# Patient Record
Sex: Male | Born: 1985 | Race: Black or African American | Hispanic: No | Marital: Single | Smoking: Never smoker
Health system: Southern US, Community
[De-identification: ages and names within clinical notes are randomized; demographics above are authoritative.]

## PROBLEM LIST (undated history)

## (undated) DIAGNOSIS — C9590 Leukemia, unspecified not having achieved remission: Secondary | ICD-10-CM

## (undated) HISTORY — PX: PORT-A-CATH REMOVAL: SHX5289

---
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
## 2010-01-12 ENCOUNTER — Inpatient Hospital Stay: Payer: Self-pay | Admitting: Internal Medicine

## 2010-03-08 ENCOUNTER — Emergency Department: Payer: Self-pay | Admitting: Emergency Medicine

## 2010-12-13 ENCOUNTER — Emergency Department: Payer: Self-pay | Admitting: Emergency Medicine

## 2011-02-13 ENCOUNTER — Ambulatory Visit: Payer: Self-pay | Admitting: Oncology

## 2011-02-22 ENCOUNTER — Ambulatory Visit: Payer: Self-pay | Admitting: Oncology

## 2012-10-07 ENCOUNTER — Emergency Department: Payer: Self-pay | Admitting: Emergency Medicine

## 2013-05-25 ENCOUNTER — Emergency Department: Payer: Self-pay | Admitting: Emergency Medicine

## 2013-05-26 ENCOUNTER — Ambulatory Visit: Payer: Self-pay | Admitting: Oncology

## 2013-05-26 LAB — CBC WITH DIFFERENTIAL/PLATELET
BASOS ABS: 0 10*3/uL (ref 0.0–0.1)
BASOS PCT: 0.9 %
Eosinophil #: 0.2 10*3/uL (ref 0.0–0.7)
Eosinophil %: 3.1 %
HCT: 46 % (ref 40.0–52.0)
HGB: 15.7 g/dL (ref 13.0–18.0)
LYMPHS PCT: 49.1 %
Lymphocyte #: 2.6 10*3/uL (ref 1.0–3.6)
MCH: 29.9 pg (ref 26.0–34.0)
MCHC: 34.1 g/dL (ref 32.0–36.0)
MCV: 88 fL (ref 80–100)
MONOS PCT: 10.7 %
Monocyte #: 0.6 x10 3/mm (ref 0.2–1.0)
NEUTROS ABS: 1.9 10*3/uL (ref 1.4–6.5)
Neutrophil %: 36.2 %
Platelet: 194 10*3/uL (ref 150–440)
RBC: 5.25 10*6/uL (ref 4.40–5.90)
RDW: 13.7 % (ref 11.5–14.5)
WBC: 5.2 10*3/uL (ref 3.8–10.6)

## 2013-05-26 LAB — BASIC METABOLIC PANEL
ANION GAP: 6 — AB (ref 7–16)
BUN: 18 mg/dL (ref 7–18)
CALCIUM: 9 mg/dL (ref 8.5–10.1)
CREATININE: 1.44 mg/dL — AB (ref 0.60–1.30)
Chloride: 104 mmol/L (ref 98–107)
Co2: 29 mmol/L (ref 21–32)
EGFR (African American): >60
EGFR (Non-African Amer.): >60
GLUCOSE: 96 mg/dL (ref 65–99)
OSMOLALITY: 279 (ref 275–301)
Potassium: 3.5 mmol/L (ref 3.5–5.1)
Sodium: 139 mmol/L (ref 136–145)

## 2014-04-28 ENCOUNTER — Ambulatory Visit: Payer: Self-pay | Admitting: General Practice

## 2014-06-23 ENCOUNTER — Ambulatory Visit: Payer: Self-pay

## 2015-05-21 ENCOUNTER — Emergency Department
Admission: EM | Admit: 2015-05-21 | Discharge: 2015-05-21 | Disposition: A | Discharge status: Home / Self Care | Payer: Self-pay | Attending: Emergency Medicine | Admitting: Emergency Medicine

## 2015-05-21 ENCOUNTER — Encounter: Payer: Self-pay | Admitting: Emergency Medicine

## 2015-05-21 DIAGNOSIS — M791 Myalgia, unspecified site: Secondary | ICD-10-CM

## 2015-05-21 HISTORY — DX: Leukemia, unspecified not having achieved remission: C95.90

## 2015-05-21 LAB — BASIC METABOLIC PANEL
Anion gap: 6 (ref 5–15)
BUN: 19 mg/dL (ref 6–20)
CO2: 30 mmol/L (ref 22–32)
Calcium: 9.6 mg/dL (ref 8.9–10.3)
Chloride: 105 mmol/L (ref 101–111)
Creatinine, Ser: 1.28 mg/dL — ABNORMAL HIGH (ref 0.61–1.24)
Glucose, Bld: 99 mg/dL (ref 65–99)
POTASSIUM: 4.7 mmol/L (ref 3.5–5.1)
SODIUM: 141 mmol/L (ref 135–145)

## 2015-05-21 LAB — CBC WITH DIFFERENTIAL/PLATELET
BASOS ABS: 0 10*3/uL (ref 0–0.1)
BASOS PCT: 1 %
Eosinophils Absolute: 0.1 10*3/uL (ref 0–0.7)
Eosinophils Relative: 3 %
HCT: 45.1 % (ref 40.0–52.0)
Hemoglobin: 15.3 g/dL (ref 13.0–18.0)
LYMPHS PCT: 54 %
Lymphs Abs: 1.6 10*3/uL (ref 1.0–3.6)
MCH: 29 pg (ref 26.0–34.0)
MCHC: 34 g/dL (ref 32.0–36.0)
MCV: 85.3 fL (ref 80.0–100.0)
Monocytes Absolute: 0.3 10*3/uL (ref 0.2–1.0)
Monocytes Relative: 11 %
NEUTROS ABS: 0.9 10*3/uL — AB (ref 1.4–6.5)
NEUTROS PCT: 31 %
Platelets: 216 10*3/uL (ref 150–440)
RBC: 5.28 MIL/uL (ref 4.40–5.90)
RDW: 14.1 % (ref 11.5–14.5)
WBC: 3 10*3/uL — AB (ref 3.8–10.6)

## 2015-05-21 MED ORDER — DIAZEPAM 5 MG/ML IJ SOLN
5.0000 mg | Freq: Once | INTRAMUSCULAR | Status: AC
  Administered 2015-05-21: 5 mg via INTRAMUSCULAR
  Filled 2015-05-21: qty 2

## 2015-05-21 MED ORDER — IBUPROFEN 800 MG PO TABS: 800.0000 mg | ORAL_TABLET | Freq: Three times a day (TID) | ORAL | Status: DC | PRN

## 2015-05-21 MED ORDER — KETOROLAC TROMETHAMINE 60 MG/2ML IM SOLN
60.0000 mg | Freq: Once | INTRAMUSCULAR | Status: AC
  Administered 2015-05-21: 60 mg via INTRAMUSCULAR
  Filled 2015-05-21: qty 2

## 2015-05-21 MED ORDER — CYCLOBENZAPRINE HCL 10 MG PO TABS: 10.0000 mg | ORAL_TABLET | Freq: Three times a day (TID) | ORAL | Status: DC | PRN

## 2015-05-21 NOTE — ED Notes (Signed)
States awoke this am with swelling R hand and bump on forehead. Now hand noted slightly swollen, bump on forehead resolved. States has had leg cramps at night that keep him from sleeping. Denies any injuries.

## 2015-05-21 NOTE — Discharge Instructions (Signed)

## 2015-05-21 NOTE — ED Provider Notes (Signed)
[facility] Emergency Department Provider Note  ____________________________________________  Time seen: Approximately 11:03 AM  I have reviewed the triage vital signs and the nursing notes.   HISTORY  Chief Complaint Hand Problem    HPI Anthony Parks is a 30 y.o. male who presents with complaints of right arm swelling right hand swelling. Complains of basically all his legs and muscles are cramping. States this has been keeping him awake at nighttime. Past medical history is significant for leukemia.   Past Medical History  Diagnosis Date  . Leukemia ([place])     states had leukemia as a child, now resolved, does not know type    There are no active problems to display for this patient.   History reviewed. No pertinent past surgical history.  Current Outpatient Rx  Name  Route  Sig  Dispense  Refill  . cyclobenzaprine (FLEXERIL) 10 MG tablet   Oral   Take 1 tablet (10 mg total) by mouth every 8 (eight) hours as needed for muscle spasms.   30 tablet   1   . ibuprofen (ADVIL,MOTRIN) 800 MG tablet   Oral   Take 1 tablet (800 mg total) by mouth every 8 (eight) hours as needed.   30 tablet   0     Allergies Review of patient's allergies indicates no known allergies.  No family history on file.  Social History Social History  Substance Use Topics  . Smoking status: Never Smoker   . Smokeless tobacco: None  . Alcohol Use: No    Review of Systems Constitutional: No fever/chills Eyes: No visual changes. ENT: No sore throat. Cardiovascular: Denies chest pain. Respiratory: Denies shortness of breath. Gastrointestinal: No abdominal pain.  No nausea, no vomiting.  No diarrhea.  No constipation. Genitourinary: Negative for dysuria. Musculoskeletal: Negative for back pain. Skin: Negative for rash. Neurological: Negative for headaches, focal weakness or numbness.  10-point ROS otherwise  negative.  ____________________________________________   PHYSICAL EXAM:  VITAL SIGNS: ED Triage Vitals  Enc Vitals Group     BP 05/21/15 1019 117/67 mmHg     Pulse Rate 05/21/15 1019 68     Resp 05/21/15 1019 20     Temp 05/21/15 1019 97.9 F (36.6 C)     Temp Source 05/21/15 1019 Oral     SpO2 05/21/15 1019 100 %     Weight 05/21/15 1019 204 lb (92.534 kg)     Height 05/21/15 1019 6' (1.829 m)     Head Cir --      Peak Flow --      Pain Score 05/21/15 1020 3     Pain Loc --      Pain Edu? --      Excl. in [place]? --     Constitutional: Alert and oriented. Well appearing and in no acute distress. Head: Atraumatic. Nose: No congestion/rhinnorhea. Mouth/Throat: Mucous membranes are moist.  Oropharynx non-erythematous. Neck: No stridor.   Cardiovascular: Normal rate, regular rhythm. Grossly normal heart sounds.  Good peripheral circulation. Respiratory: Normal respiratory effort.  No retractions. Lungs CTAB. Musculoskeletal: No lower extremity tenderness nor edema.  No joint effusions. Neurologic:  Normal speech and language. No gross focal neurologic deficits are appreciated. No gait instability. Skin:  Skin is warm, dry and intact. No rash noted. Psychiatric: Mood and affect are normal. Speech and behavior are normal.  ____________________________________________   LABS (all labs ordered are listed, but only abnormal results are displayed)  Labs Reviewed  BASIC METABOLIC PANEL -  Abnormal; Notable for the following:    Creatinine, Ser 1.28 (*)    All other components within normal limits  CBC WITH DIFFERENTIAL/PLATELET - Abnormal; Notable for the following:    WBC 3.0 (*)    Neutro Abs 0.9 (*)    All other components within normal limits     PROCEDURES  Procedure(s) performed: None  Critical Care performed: No  ____________________________________________   INITIAL IMPRESSION / ASSESSMENT AND PLAN / ED COURSE  Pertinent labs & imaging results that were  available during my care of the patient were reviewed by me and considered in my medical decision making (see chart for details).  Generalized myalgia. Rx given for Flexeril 10 mg 3 times a day, Motrin 800 mg 3 times a day. Patient was given an injection of Toradol and Valium while in the ED. Patient follow-up with PCP or return to ER with any worsening symptomology. Patient voices no other emergency medical complaints at this time. ____________________________________________   FINAL CLINICAL IMPRESSION(S) / ED DIAGNOSES  Final diagnoses:  Myalgia      Arlyss Repress, PA-C 05/21/15 1211  Harvest Dark, MD 05/21/15 1427

## 2015-08-01 ENCOUNTER — Emergency Department
Admission: EM | Admit: 2015-08-01 | Discharge: 2015-08-01 | Disposition: A | Discharge status: Home / Self Care | Payer: Self-pay | Attending: Emergency Medicine | Admitting: Emergency Medicine

## 2015-08-01 DIAGNOSIS — Z711 Person with feared health complaint in whom no diagnosis is made: Secondary | ICD-10-CM | POA: Insufficient documentation

## 2015-08-01 NOTE — Discharge Instructions (Signed)

## 2015-08-01 NOTE — ED Provider Notes (Signed)
[facility] Emergency Department Provider Note  ____________________________________________  Time seen: Approximately 12:17 PM  I have reviewed the triage vital signs and the nursing notes.   HISTORY  Chief Complaint Medical Clearance    HPI Anthony Parks is a 30 y.o. male , NAD, presents emergency department to gain a return to work note. States he felt some left-sided groin pain while at work Friday night. Was lifting boxes and machine parts from the floor when the pain began. States he left work early and stay out of work for 2 days to rest. States he has had no further pain and is ready to return to work full duty. Has not taken anything for pain. Denies any masses or swellings about the groin area. Denies urinary frequency, hesitancy, urgency nor any urethral discharge. Has had no testicular pain. Denies numbness, weakness, tingling. Has had no lower back pain.   Past Medical History  Diagnosis Date  . 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   Past Surgical History  Procedure Laterality Date  . Port-a-cath removal      Current Outpatient Rx  Name  Route  Sig  Dispense  Refill  . cyclobenzaprine (FLEXERIL) 10 MG tablet   Oral   Take 1 tablet (10 mg total) by mouth every 8 (eight) hours as needed for muscle spasms.   30 tablet   1   . ibuprofen (ADVIL,MOTRIN) 800 MG tablet   Oral   Take 1 tablet (800 mg total) by mouth every 8 (eight) hours as needed.   30 tablet   0     Allergies Review of patient's allergies indicates no known allergies.  No family history on file.  Social History Social History  Substance Use Topics  . Smoking status: Never Smoker   . Smokeless tobacco: None  . Alcohol Use: No     Review of Systems  Constitutional: No fever/chills, fatigue Cardiovascular: No chest pain. Respiratory: No cough. No  shortness of breath.   Gastrointestinal: No abdominal pain.  No nausea, vomiting.   Genitourinary: Positive left groin pain that has resolved. Negative for dysuria, urethral discharge, hematuria. No urinary hesitancy, urgency or increased frequency. Musculoskeletal: Negative for back pain.  Skin: Negative for rash, skin sores. Neurological: Negative for headaches, focal weakness or numbness. 10-point ROS otherwise negative.  ____________________________________________   PHYSICAL EXAM:  VITAL SIGNS: ED Triage Vitals  Enc Vitals Group     BP 08/01/15 1207 131/75 mmHg     Pulse Rate 08/01/15 1207 56     Resp 08/01/15 1207 16     Temp 08/01/15 1207 97.6 F (36.4 C)     Temp Source 08/01/15 1207 Oral     SpO2 08/01/15 1207 99 %     Weight 08/01/15 1207 205 lb (92.987 kg)     Height 08/01/15 1207 6' (1.829 m)     Head Cir --      Peak Flow --      Pain Score --      Pain Loc --      Pain Edu? --      Excl. in [place]? --     Physical exam completed in the presence of Halford Decamp, RN  Constitutional: Alert and oriented. Well appearing and in no acute distress. Eyes: Conjunctivae are normal.  Head: Atraumatic. Hematological/Lymphatic/Immunilogical: No inguinal lymphadenopathy.  Cardiovascular: Good peripheral circulation. Respiratory: Normal respiratory effort without tachypnea or retractions.  Gastrointestinal: Soft and nontender. No distention or guarding.  Genitourinary: Tanner Stage 5. No inguinal nor scrotal hernias. External genitalia without abnormality.  Musculoskeletal: Full range of motion of lumbar spine without stiffness or pain. No tenderness to palpation about the lumbar or sacral spine. No lower extremity tenderness nor edema.  No joint effusions. Neurologic:  Normal speech and language. No gross focal neurologic deficits are appreciated.  Skin:  Skin is warm, dry and intact. No rash, swelling, bruising noted. Psychiatric: Mood and affect are normal. Speech and  behavior are normal. Patient exhibits appropriate insight and judgement.   ____________________________________________   LABS  None  ____________________________________________  EKG  None ____________________________________________  RADIOLOGY  None ____________________________________________    PROCEDURES  Procedure(s) performed: None      Medications - No data to display   ____________________________________________   INITIAL IMPRESSION / ASSESSMENT AND PLAN / ED COURSE  Reassured that he had no evidence of any hernias at this time. He was given a return to work note stating he could return to work full duty as of today. Patient will be discharged home with instructions for back exercises to strengthen his lower back and core since he will be doing more lifting with his current employment.  Patient is given ED precautions to return to the ED for any worsening or new symptoms.      ____________________________________________  FINAL CLINICAL IMPRESSION(S) / ED DIAGNOSES  Final diagnoses:  Feared complaint without diagnosis      NEW MEDICATIONS STARTED DURING THIS VISIT:  Discharge Medication List as of 08/01/2015 12:26 PM           Braxton Feathers, PA-C 08/01/15 1327  Lavonia Drafts, MD 08/01/15 1440

## 2015-08-01 NOTE — ED Notes (Signed)
Pt states he hurt his back at work and went back to work yesterday but they told him he had to be checked out before he could return to work

## 2015-08-01 NOTE — ED Notes (Signed)
States he felt some pain to groin area on Friday while at work.  States pain went into back and groin  Feels better today but work wants him checked out   Denies any urinary sx's or swelling to groin area

## 2015-11-27 ENCOUNTER — Emergency Department
Admission: EM | Admit: 2015-11-27 | Discharge: 2015-11-27 | Disposition: A | Discharge status: Home / Self Care | Payer: Self-pay | Attending: Emergency Medicine | Admitting: Emergency Medicine

## 2015-11-27 ENCOUNTER — Encounter: Payer: Self-pay | Admitting: Emergency Medicine

## 2015-11-27 ENCOUNTER — Emergency Department: Payer: Self-pay

## 2015-11-27 DIAGNOSIS — W500XXA Accidental hit or strike by another person, initial encounter: Secondary | ICD-10-CM | POA: Insufficient documentation

## 2015-11-27 DIAGNOSIS — Y9367 Activity, basketball: Secondary | ICD-10-CM | POA: Insufficient documentation

## 2015-11-27 DIAGNOSIS — G8911 Acute pain due to trauma: Secondary | ICD-10-CM

## 2015-11-27 DIAGNOSIS — S8992XA Unspecified injury of left lower leg, initial encounter: Secondary | ICD-10-CM | POA: Insufficient documentation

## 2015-11-27 DIAGNOSIS — Y929 Unspecified place or not applicable: Secondary | ICD-10-CM | POA: Insufficient documentation

## 2015-11-27 DIAGNOSIS — Y999 Unspecified external cause status: Secondary | ICD-10-CM | POA: Insufficient documentation

## 2015-11-27 DIAGNOSIS — M79605 Pain in left leg: Secondary | ICD-10-CM

## 2015-11-27 MED ORDER — NAPROXEN 500 MG PO TABS: 500.0000 mg | ORAL_TABLET | Freq: Two times a day (BID) | ORAL | 0 refills | Status: DC

## 2015-11-27 MED ORDER — HYDROCODONE-ACETAMINOPHEN 5-325 MG PO TABS
1.0000 | ORAL_TABLET | Freq: Once | ORAL | Status: AC
  Administered 2015-11-27: 1 via ORAL
  Filled 2015-11-27: qty 1

## 2015-11-27 MED ORDER — HYDROCODONE-ACETAMINOPHEN 5-325 MG PO TABS: 1.0000 | ORAL_TABLET | ORAL | 0 refills | Status: DC | PRN

## 2015-11-27 NOTE — ED Provider Notes (Signed)
[facility] Emergency Department Provider Note ____________________________________________  Time seen: Approximately 6:41 PM  I have reviewed the triage vital signs and the nursing notes.   HISTORY  Chief Complaint Leg Injury    HPI Anthony Parks is a 30 y.o. male who presents to the emergency department for evaluation of left leg pain. Prior to arrival, he was playing basketball and someone rammed into his body, and then he heard a pop in his left knee and leg. Pain is sharp and shooting from the anterior aspect of the knee radiating to the ankle.   Past Medical History:  Diagnosis Date  . 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   Past Surgical History:  Procedure Laterality Date  . PORT-A-CATH REMOVAL      Prior to Admission medications   Medication Sig Start Date End Date Taking? Authorizing Provider  cyclobenzaprine (FLEXERIL) 10 MG tablet Take 1 tablet (10 mg total) by mouth every 8 (eight) hours as needed for muscle spasms. 05/21/15   Arlyss Repress, PA-C  HYDROcodone-acetaminophen (NORCO/VICODIN) 5-325 MG tablet Take 1 tablet by mouth every 4 (four) hours as needed for moderate pain. 11/27/15 11/26/16  Victorino Dike, FNP  ibuprofen (ADVIL,MOTRIN) 800 MG tablet Take 1 tablet (800 mg total) by mouth every 8 (eight) hours as needed. 05/21/15   Pierce Crane Beers, PA-C  naproxen (NAPROSYN) 500 MG tablet Take 1 tablet (500 mg total) by mouth 2 (two) times daily with a meal. 11/27/15   Victorino Dike, FNP    Allergies Review of patient's allergies indicates no known allergies.  History reviewed. No pertinent family history.  Social History Social History  Substance Use Topics  . Smoking status: Never Smoker  . Smokeless tobacco: Never Used  . Alcohol use No    Review of Systems Constitutional: No recent illness. Cardiovascular: Denies chest  pain or palpitations. Respiratory: Denies shortness of breath. Musculoskeletal: Pain in Left lower extremity Skin: Negative for rash, wound, lesion. Neurological: Negative for focal weakness or numbness.  ____________________________________________   PHYSICAL EXAM:  VITAL SIGNS: ED Triage Vitals [11/27/15 1832]  Enc Vitals Group     BP (!) 137/97     Pulse Rate (!) 115     Resp 20     Temp 99 F (37.2 C)     Temp Source Oral     SpO2 97 %     Weight 205 lb (93 kg)     Height 6' (1.829 m)     Head Circumference      Peak Flow      Pain Score 10     Pain Loc      Pain Edu?      Excl. in [place]?     Constitutional: Alert and oriented. Well appearing and in no acute distress. Eyes: Conjunctivae are normal. EOMI. Head: Atraumatic. Neck: No stridor.  Respiratory: Normal respiratory effort.   Musculoskeletal: Diffuse tenderness of the left lower extremity from the anterior aspect of the knee to the ankle without obvious deformity. There is no focal bony tenderness elicited on exam. Neurologic:  Normal speech and language. No gross focal neurologic deficits are appreciated. Speech is normal. No gait instability. Skin:  Skin is warm, dry and intact. Atraumatic. Psychiatric: Mood and affect are normal. Speech and behavior are normal.  ____________________________________________   LABS (all labs  ordered are listed, but only abnormal results are displayed)  Labs Reviewed - No data to display ____________________________________________  RADIOLOGY  Negative for acute bony abnormality. ____________________________________________   PROCEDURES  Procedure(s) performed: Knee immobilizer applied to the left lower extremity by ER tech. Patient was neurovascularly intact post-application.   ____________________________________________   INITIAL IMPRESSION / ASSESSMENT AND PLAN / ED COURSE  Pertinent labs & imaging results that were available during my care of the patient  were reviewed by me and considered in my medical decision making (see chart for details).  Patient was encouraged to follow up with the orthopedist for symptoms that are not improving over the week. He was encouraged to rest, ice, and elevate the lower extremity while still painful. He was given a prescription for Norco 5/325 (12 tablets) and Naprosyn. He was advised to return to the emergency department for symptoms that change or worsen if he is unable schedule an appointment with orthopedics or his primary care provider. ____________________________________________   FINAL CLINICAL IMPRESSION(S) / ED DIAGNOSES  Final diagnoses:  Acute pain due to injury  Lower extremity pain, diffuse, left       Victorino Dike, FNP 11/27/15 2011    Orbie Pyo, MD 11/27/15 2350

## 2015-11-27 NOTE — ED Triage Notes (Signed)
Pt playing basketball PTA and was hit by another player in left leg. States he heard a pop. C/o knee and leg pain that is sharp and shooting. Does not want to take shoe off at this time.

## 2016-02-01 ENCOUNTER — Emergency Department: Payer: Self-pay

## 2016-02-01 ENCOUNTER — Emergency Department
Admission: EM | Admit: 2016-02-01 | Discharge: 2016-02-01 | Disposition: A | Discharge status: Home / Self Care | Payer: Self-pay | Attending: Student in an Organized Health Care Education/Training Program | Admitting: Student in an Organized Health Care Education/Training Program

## 2016-02-01 DIAGNOSIS — R5383 Other fatigue: Secondary | ICD-10-CM | POA: Insufficient documentation

## 2016-02-01 DIAGNOSIS — R509 Fever, unspecified: Secondary | ICD-10-CM | POA: Insufficient documentation

## 2016-02-01 DIAGNOSIS — R519 Headache, unspecified: Secondary | ICD-10-CM

## 2016-02-01 DIAGNOSIS — Z79899 Other long term (current) drug therapy: Secondary | ICD-10-CM | POA: Insufficient documentation

## 2016-02-01 DIAGNOSIS — R51 Headache: Secondary | ICD-10-CM | POA: Insufficient documentation

## 2016-02-01 DIAGNOSIS — R6889 Other general symptoms and signs: Secondary | ICD-10-CM

## 2016-02-01 DIAGNOSIS — R531 Weakness: Secondary | ICD-10-CM | POA: Insufficient documentation

## 2016-02-01 DIAGNOSIS — R110 Nausea: Secondary | ICD-10-CM | POA: Insufficient documentation

## 2016-02-01 LAB — CSF CELL COUNT WITH DIFFERENTIAL
EOS CSF: 0 %
EOS CSF: 0 %
LYMPHS CSF: 86 %
Lymphs, CSF: 84 %
MONOCYTE-MACROPHAGE-SPINAL FLUID: 10 %
Monocyte-Macrophage-Spinal Fluid: 11 %
Other Cells, CSF: 0
Other Cells, CSF: 0
RBC COUNT CSF: 0 /mm3 (ref 0–3)
RBC Count, CSF: 4 /mm3 — ABNORMAL HIGH (ref 0–3)
SEGMENTED NEUTROPHILS-CSF: 3 %
SEGMENTED NEUTROPHILS-CSF: 6 %
TUBE #: 1
Tube #: 4
WBC, CSF: 11 /mm3 (ref 0–5)
WBC, CSF: 4 /mm3 (ref 0–5)

## 2016-02-01 LAB — COMPREHENSIVE METABOLIC PANEL
ALK PHOS: 67 U/L (ref 38–126)
ALT: 13 U/L — ABNORMAL LOW (ref 17–63)
AST: 18 U/L (ref 15–41)
Albumin: 4.1 g/dL (ref 3.5–5.0)
Anion gap: 8 (ref 5–15)
BILIRUBIN TOTAL: 0.4 mg/dL (ref 0.3–1.2)
BUN: 19 mg/dL (ref 6–20)
CALCIUM: 9.1 mg/dL (ref 8.9–10.3)
CO2: 26 mmol/L (ref 22–32)
CREATININE: 1.51 mg/dL — AB (ref 0.61–1.24)
Chloride: 103 mmol/L (ref 101–111)
GFR calc Af Amer: >60 mL/min (ref >60)
Glucose, Bld: 102 mg/dL — ABNORMAL HIGH (ref 65–99)
POTASSIUM: 3.9 mmol/L (ref 3.5–5.1)
Sodium: 137 mmol/L (ref 135–145)
TOTAL PROTEIN: 7.6 g/dL (ref 6.5–8.1)

## 2016-02-01 LAB — PROTEIN AND GLUCOSE, CSF
GLUCOSE CSF: 60 mg/dL (ref 40–70)
Total  Protein, CSF: 34 mg/dL (ref 15–45)

## 2016-02-01 LAB — CBC WITH DIFFERENTIAL/PLATELET
BASOS ABS: 0 10*3/uL (ref 0–0.1)
BASOS PCT: 0 %
EOS ABS: 0 10*3/uL (ref 0–0.7)
EOS PCT: 0 %
HCT: 42.5 % (ref 40.0–52.0)
Hemoglobin: 14.7 g/dL (ref 13.0–18.0)
Lymphocytes Relative: 17 %
Lymphs Abs: 1.3 10*3/uL (ref 1.0–3.6)
MCH: 29.6 pg (ref 26.0–34.0)
MCHC: 34.6 g/dL (ref 32.0–36.0)
MCV: 85.6 fL (ref 80.0–100.0)
MONO ABS: 0.8 10*3/uL (ref 0.2–1.0)
Monocytes Relative: 10 %
Neutro Abs: 5.4 10*3/uL (ref 1.4–6.5)
Neutrophils Relative %: 73 %
PLATELETS: 169 10*3/uL (ref 150–440)
RBC: 4.96 MIL/uL (ref 4.40–5.90)
RDW: 13.7 % (ref 11.5–14.5)
WBC: 7.5 10*3/uL (ref 3.8–10.6)

## 2016-02-01 LAB — INFLUENZA PANEL BY PCR (TYPE A & B)
INFLAPCR: NEGATIVE
Influenza B By PCR: NEGATIVE

## 2016-02-01 LAB — CK: CK TOTAL: 141 U/L (ref 49–397)

## 2016-02-01 MED ORDER — PROCHLORPERAZINE EDISYLATE 5 MG/ML IJ SOLN
10.0000 mg | Freq: Once | INTRAMUSCULAR | Status: AC
  Administered 2016-02-01: 10 mg via INTRAVENOUS
  Filled 2016-02-01: qty 2

## 2016-02-01 MED ORDER — BUPIVACAINE HCL (PF) 0.5 % IJ SOLN
30.0000 mL | Freq: Once | INTRAMUSCULAR | Status: AC
  Administered 2016-02-01: 10 mL
  Filled 2016-02-01: qty 30

## 2016-02-01 MED ORDER — DIPHENHYDRAMINE HCL 50 MG/ML IJ SOLN
25.0000 mg | Freq: Once | INTRAMUSCULAR | Status: AC
  Administered 2016-02-01: 25 mg via INTRAVENOUS
  Filled 2016-02-01: qty 1

## 2016-02-01 MED ORDER — DEXAMETHASONE SODIUM PHOSPHATE 10 MG/ML IJ SOLN
10.0000 mg | Freq: Four times a day (QID) | INTRAMUSCULAR | Status: DC
  Administered 2016-02-01: 10 mg via INTRAVENOUS
  Filled 2016-02-01: qty 1

## 2016-02-01 NOTE — ED Triage Notes (Signed)
Pt c/o headache with pressure, nausea and generalized fatigue for the past 2 days.Marland Kitchen

## 2016-02-01 NOTE — ED Notes (Signed)
Consent signed.

## 2016-02-01 NOTE — Discharge Instructions (Signed)

## 2016-02-01 NOTE — ED Provider Notes (Signed)
[facility] Emergency Department Provider Note    First MD Initiated Contact with Patient 02/01/16 0725     (approximate)  I have reviewed the triage vital signs and the nursing notes.   HISTORY  Chief Complaint Weakness and Headache    HPI [place] is a 30 y.o. male presents with 2 days of gradually worsening headache and pressure associated with nausea and generalized fatigue. Patient never had this current illness before. 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 States he's had multiple sick contacts at work. Does not endorse any shortness of breath or cough. No chest pains or pressure. No abdominal pain. No diarrhea. Denies any sore throat or nasal congestion. Took 2 Tylenol as well as Claritin this morning for headache which is not improving his discomfort. Currently rates the headache as a 10 out of 10 in severity and is generalized pressure in character.   Past Medical History:  Diagnosis Date  . Leukemia ([place])    states had leukemia as a child, now resolved, does not know type    There are no active problems to display for this patient.   Past Surgical History:  Procedure Laterality Date  . PORT-A-CATH REMOVAL      Prior to Admission medications   Medication Sig Start Date End Date Taking? Authorizing Provider  acetaminophen (TYLENOL) 500 MG chewable tablet Chew 1,000 mg by mouth every 6 (six) hours as needed for pain.   Yes Historical Provider, MD  loratadine (CLARITIN) 10 MG tablet Take 10 mg by mouth daily.   Yes Historical Provider, MD    Allergies Review of patient's allergies indicates no known allergies.  No family history on file.  Social History Social History  Substance Use Topics  . Smoking status: Never Smoker  . Smokeless tobacco: Never Used  . Alcohol use No    Review of Systems Patient denies headaches, rhinorrhea, blurry vision, numbness, shortness of breath, chest pain, edema,  cough, abdominal pain, nausea, vomiting, diarrhea, dysuria, fevers, rashes or hallucinations unless otherwise stated above in HPI. ____________________________________________   PHYSICAL EXAM:  VITAL SIGNS: Vitals:   02/01/16 1130 02/01/16 1535  BP: 100/64 111/87  Pulse: 70 95  Resp:  18  Temp:      Constitutional: Alert and oriented. Uncomfortable appearing but in no acute distress. Eyes: Conjunctivae are normal. PERRL. EOMI. Head: Atraumatic. Nose: No congestion/rhinnorhea. Mouth/Throat: Mucous membranes are moist.  Oropharynx non-erythematous. Neck: No stridor. Manipulation of the head and neck causes worsening headache.  No cervical spine tenderness to palpation Hematological/Lymphatic/Immunilogical: No cervical lymphadenopathy. Cardiovascular: Normal rate, regular rhythm. Grossly normal heart sounds.  Good peripheral circulation. Respiratory: Normal respiratory effort.  No retractions. Lungs CTAB. Gastrointestinal: Soft and nontender. No distention. No abdominal bruits. No CVA tenderness. Genitourinary:  Musculoskeletal: No lower extremity tenderness nor edema.  No joint effusions. Neurologic: CN- intact.  No facial droop, Normal FNF.  Normal heel to shin.  Sensation intact bilaterally. Normal speech and language. No gross focal neurologic deficits are appreciated. No gait instability. Skin:  Skin is warm, dry and intact. No rash noted. Psychiatric: Mood and affect are normal. Speech and behavior are normal.  ____________________________________________   LABS (all labs ordered are listed, but only abnormal results are displayed)  Results for orders placed or performed during the hospital encounter of 02/01/16 (from the past 24 hour(s))  Comprehensive metabolic panel     Status: Abnormal   Collection Time: 02/01/16  7:35 AM  Result Value Ref Range   Sodium 137 135 - 145 mmol/L   Potassium 3.9 3.5 - 5.1 mmol/L   Chloride 103 101 - 111 mmol/L   CO2 26 22 - 32 mmol/L    Glucose, Bld 102 (H) 65 - 99 mg/dL   BUN 19 6 - 20 mg/dL   Creatinine, Ser 1.51 (H) 0.61 - 1.24 mg/dL   Calcium 9.1 8.9 - 10.3 mg/dL   Total Protein 7.6 6.5 - 8.1 g/dL   Albumin 4.1 3.5 - 5.0 g/dL   AST 18 15 - 41 U/L   ALT 13 (L) 17 - 63 U/L   Alkaline Phosphatase 67 38 - 126 U/L   Total Bilirubin 0.4 0.3 - 1.2 mg/dL   GFR calc non Af Amer >60 >60 mL/min   GFR calc Af Amer >60 >60 mL/min   Anion gap 8 5 - 15  CBC with Differential/Platelet     Status: None   Collection Time: 02/01/16  7:35 AM  Result Value Ref Range   WBC 7.5 3.8 - 10.6 K/uL   RBC 4.96 4.40 - 5.90 MIL/uL   Hemoglobin 14.7 13.0 - 18.0 g/dL   HCT 42.5 40.0 - 52.0 %   MCV 85.6 80.0 - 100.0 fL   MCH 29.6 26.0 - 34.0 pg   MCHC 34.6 32.0 - 36.0 g/dL   RDW 13.7 11.5 - 14.5 %   Platelets 169 150 - 440 K/uL   Neutrophils Relative % 73 %   Neutro Abs 5.4 1.4 - 6.5 K/uL   Lymphocytes Relative 17 %   Lymphs Abs 1.3 1.0 - 3.6 K/uL   Monocytes Relative 10 %   Monocytes Absolute 0.8 0.2 - 1.0 K/uL   Eosinophils Relative 0 %   Eosinophils Absolute 0.0 0 - 0.7 K/uL   Basophils Relative 0 %   Basophils Absolute 0.0 0 - 0.1 K/uL  Influenza panel by PCR (type A & B, H1N1)     Status: None   Collection Time: 02/01/16  7:35 AM  Result Value Ref Range   Influenza A By PCR NEGATIVE NEGATIVE   Influenza B By PCR NEGATIVE NEGATIVE  CK     Status: None   Collection Time: 02/01/16  7:35 AM  Result Value Ref Range   Total CK 141 49 - 397 U/L  CSF cell count with differential collection tube #: 1     Status: Abnormal   Collection Time: 02/01/16 10:35 AM  Result Value Ref Range   Tube # 1    Color, CSF COLORLESS COLORLESS   Appearance, CSF CLEAR CLEAR   RBC Count, CSF 4 (H) 0 - 3 /cu mm   WBC, CSF 4 0 - 5 /cu mm   Segmented Neutrophils-CSF 3 %   Lymphs, CSF 86 %   Monocyte-Macrophage-Spinal Fluid 11 %   Eosinophils, CSF 0 %   Other Cells, CSF 0   CSF cell count with differential collection tube #: 4     Status: Abnormal    Collection Time: 02/01/16 10:35 AM  Result Value Ref Range   Tube # 4    Color, CSF COLORLESS COLORLESS   Appearance, CSF CLEAR CLEAR   RBC Count, CSF 0 0 - 3 /cu mm   WBC, CSF 11 (HH) 0 - 5 /cu mm   Segmented Neutrophils-CSF 6 %   Lymphs, CSF 84 %   Monocyte-Macrophage-Spinal Fluid 10 %   Eosinophils, CSF 0 %   Other Cells, CSF 0  Protein and glucose, CSF     Status: None   Collection Time: 02/01/16 10:35 AM  Result Value Ref Range   Glucose, CSF 60 40 - 70 mg/dL   Total  Protein, CSF 34 15 - 45 mg/dL  CSF culture     Status: None (Preliminary result)   Collection Time: 02/01/16 10:35 AM  Result Value Ref Range   Specimen Description CSF    Special Requests NONE    Gram Stain NO ORGANISMS SEEN FEW WBCS NO RBCS SEEN     Culture PENDING    Report Status PENDING    ____________________________________________ ____________________________________________  RADIOLOGY  I personally reviewed all radiographic images ordered to evaluate for the above acute complaints and reviewed radiology reports and findings.  These findings were personally discussed with the patient.  Please see medical record for radiology report.  ____________________________________________   PROCEDURES  Procedure(s) performed:  .Lumbar Puncture Date/Time: 02/01/2016 3:52 PM Performed by: Merlyn Lot Authorized by: Merlyn Lot   Consent:    Consent obtained:  Written   Consent given by:  Patient   Risks discussed:  Bleeding, headache, nerve damage and infection   Alternatives discussed:  No treatment Pre-procedure details:    Procedure purpose:  Diagnostic Anesthesia (see MAR for exact dosages):    Anesthesia method:  Local infiltration   Local anesthetic:  Bupivacaine 0.25% w/o epi and lidocaine 1% w/o epi Procedure details:    Lumbar space:  L3-L4 interspace   Patient position:  Sitting   Needle gauge:  22   Needle type:  Diamond point   Needle length (in):  3.5   Ultrasound  guidance: no     Number of attempts:  1   Fluid appearance:  Clear   Tubes of fluid:  4   Total volume (ml):  4 Post-procedure:    Puncture site:  Adhesive bandage applied   Patient tolerance of procedure:  Tolerated with difficulty       Critical Care performed: no ____________________________________________   INITIAL IMPRESSION / ASSESSMENT AND PLAN / ED COURSE  Pertinent labs & imaging results that were available during my care of the patient were reviewed by me and considered in my medical decision making (see chart for details).  DDX: Flu, URI, viral illness, pneumonia, meningitis  Dj Rael Avant is a 30 y.o. who presents to the ED with above complaints. Patient with low-grade fever but otherwise hemodynamically stable. Appears uncomfortable but is not in acute distress. Abdominal exam is soft and benign. No focal neurologic deficits on exam. Based on duration symptoms have a low suspicion for meningitis as I expect that his clinical condition will be far worse after 2 days of a bacterial infection. We will also consider fluids or having recent positives.  The patient will be placed on continuous pulse oximetry and telemetry for monitoring.  Laboratory evaluation will be sent to evaluate for the above complaints.     Clinical Course  Value Comment By Time   Patient reassessed. Still complaining of headache and neck stiffness. No other etiology of acute presentation. We'll perform LP due to concern for meningitis. Merlyn Lot, MD 10/11 [phone]  Total  Protein, CSF: 34 (Reviewed) Merlyn Lot, MD 10/11 1432   Lumbar puncture with laboratory results showing possible mild viral meningitis. Sterile and not consistent with bacterial or fungal meningitis. Patient's symptoms have improved. I do feel the patient is stable for close outpatient follow-up.  Patient was able to tolerate PO and was able to ambulate  with a steady gait.  Have discussed with the patient and available family  all diagnostics and treatments performed thus far and all questions were answered to the best of my ability. The patient demonstrates understanding and agreement with plan.  Merlyn Lot, MD 10/11 1512     ____________________________________________   FINAL CLINICAL IMPRESSION(S) / ED DIAGNOSES  Final diagnoses:  Flu-like symptoms  Acute nonintractable headache, unspecified headache type  Fever, unspecified fever cause      NEW MEDICATIONS STARTED DURING THIS VISIT:  New Prescriptions   No medications on file     Note:  This document was prepared using Dragon voice recognition software and may include unintentional dictation errors.    Merlyn Lot, MD 02/01/16 [phone]

## 2016-02-01 NOTE — ED Notes (Signed)

## 2016-02-05 LAB — CSF CULTURE W GRAM STAIN
Culture: NO GROWTH
Gram Stain: NONE SEEN

## 2016-02-05 LAB — CSF CULTURE

## 2016-02-06 LAB — CULTURE, BLOOD (ROUTINE X 2)
CULTURE: NO GROWTH
Culture: NO GROWTH

## 2019-01-07 ENCOUNTER — Ambulatory Visit: Payer: Self-pay | Admitting: *Deleted

## 2019-01-07 NOTE — Telephone Encounter (Signed)
Patient called to say that he sprained his hand and did not want to go in to the office to be seen due to financial hardship but he states that it is swollen and he needed to know what he should be doing at home to care for the hand injury. He does not think anything is broken but need some advise please. Can be reached at Ph# [phone]   Call to patient- he is concerned that he may have pulled or torn tendon in shoving match. Patient is having pain and can not hold things, make fist or bend wrist back without pain. There is swelling in the wrist area. Patient is concerned about the cost of a visit but he does have a  Health card to use. Attempted to call office for appointment- but did not get answer- patient informed the messa ge will be sent and he will be called back.  Reason for Disposition . Can't use injured hand normally (e.g., make a fist, open fully, hold a glass of water)  Answer Assessment - Initial Assessment Questions 1. MECHANISM: "How did the injury happen?"     Conflict- bent wrist- felt pop in R wrist- pushing 2. ONSET: "When did the injury happen?" (Minutes or hours ago)      Sunday morning 3. APPEARANCE of INJURY: "What does the injury look like?"      Patient has been using ice- middle of wrist is painful and feels a knot of top of wrist 4. SEVERITY: "Can you use the hand normally?" "Can you bend your fingers into a ball and then fully open them?"     Fingers are fine- but holding something is unconfortable  5. SIZE: For cuts, bruises, or swelling, ask: "How large is it?" (e.g., inches or centimeters;  entire hand or wrist)      No bruising present- just swelling in the wrist area 6. PAIN: "Is there pain?" If so, ask: "How bad is the pain?"  (Scale 1-10; or mild, moderate, severe)     Pain with moving wrist- can't bend wrist back 7. TETANUS: For any breaks in the skin, ask: "When was the last tetanus booster?"     n/a 8. OTHER SYMPTOMS: "Do you have any other symptoms?"       Just the hand injury- squeezing fist or moving in circular motion 9. PREGNANCY: "Is there any chance you are pregnant?" "When was your last menstrual period?"     n/a  Protocols used: HAND AND WRIST INJURY-A-AH

## 2019-01-07 NOTE — Telephone Encounter (Signed)
Due to patient status and the fact that he would be considered new patient- advised patient to go to Urgent Care for evaluation- he will go.

## 2019-01-15 ENCOUNTER — Other Ambulatory Visit: Payer: Self-pay

## 2019-01-15 ENCOUNTER — Emergency Department: Payer: Self-pay

## 2019-01-15 ENCOUNTER — Emergency Department
Admission: EM | Admit: 2019-01-15 | Discharge: 2019-01-15 | Disposition: A | Discharge status: Home / Self Care | Payer: Self-pay | Attending: Emergency Medicine | Admitting: Emergency Medicine

## 2019-01-15 ENCOUNTER — Encounter: Payer: Self-pay | Admitting: Emergency Medicine

## 2019-01-15 DIAGNOSIS — Y999 Unspecified external cause status: Secondary | ICD-10-CM | POA: Insufficient documentation

## 2019-01-15 DIAGNOSIS — Z79899 Other long term (current) drug therapy: Secondary | ICD-10-CM | POA: Insufficient documentation

## 2019-01-15 DIAGNOSIS — Y9367 Activity, basketball: Secondary | ICD-10-CM | POA: Insufficient documentation

## 2019-01-15 DIAGNOSIS — W500XXA Accidental hit or strike by another person, initial encounter: Secondary | ICD-10-CM | POA: Insufficient documentation

## 2019-01-15 DIAGNOSIS — Y92310 Basketball court as the place of occurrence of the external cause: Secondary | ICD-10-CM | POA: Insufficient documentation

## 2019-01-15 DIAGNOSIS — S62001A Unspecified fracture of navicular [scaphoid] bone of right wrist, initial encounter for closed fracture: Secondary | ICD-10-CM | POA: Insufficient documentation

## 2019-01-15 MED ORDER — MELOXICAM 15 MG PO TABS: 15.0000 mg | ORAL_TABLET | Freq: Every day | ORAL | 2 refills | Status: AC

## 2019-01-15 NOTE — ED Triage Notes (Signed)
Patient ambulatory to triage with steady gait, without difficulty or distress noted; pt reports injuring rt wrist wk ago after shoving another playing on bball court; st 'felt a pop" and pain has persisted

## 2019-01-15 NOTE — Discharge Instructions (Addendum)
Follow[facility]up with [facility].  Please call for an appointment.  Tell them you are seen in the ER and have a scaphoid fracture.  Do not remove the splint.  This is protecting your wrist so that you do not get chronic pain with this fracture.

## 2019-01-15 NOTE — ED Provider Notes (Signed)
[facility] Emergency Department Provider Note  ____________________________________________   First MD Initiated Contact with Patient 01/15/19 2234     (approximate)  I have reviewed the triage vital signs and the nursing notes.   HISTORY  Chief Complaint Wrist Pain    HPI Anthony Parks is a 33 y.o. male presents emergency department complaining of right wrist pain.  States he shoved another player while on the basketball court and felt a pop.  Pain is been persistent.  No numbness or tingling.    Past Medical History:  Diagnosis Date  . 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   Past Surgical History:  Procedure Laterality Date  . PORT-A-CATH REMOVAL      Prior to Admission medications   Medication Sig Start Date End Date Taking? Authorizing Provider  acetaminophen (TYLENOL) 500 MG chewable tablet Chew 1,000 mg by mouth every 6 (six) hours as needed for pain.    [provider]  loratadine (CLARITIN) 10 MG tablet Take 10 mg by mouth daily.    [provider]  meloxicam (MOBIC) 15 MG tablet Take 1 tablet (15 mg total) by mouth daily. 01/15/19 01/15/20  Versie Starks, PA-C    Allergies Patient has no known allergies.  No family history on file.  Social History Social History   Tobacco Use  . Smoking status: Never Smoker  . Smokeless tobacco: Never Used  Substance Use Topics  . Alcohol use: No  . Drug use: Not on file    Review of Systems  Constitutional: No fever/chills Eyes: No visual changes. ENT: No sore throat. Respiratory: Denies cough Genitourinary: Negative for dysuria. Musculoskeletal: Negative for back pain.  Right wrist pain Skin: Negative for rash.    ____________________________________________   PHYSICAL EXAM:  VITAL SIGNS: ED Triage Vitals  Enc Vitals Group     BP 01/15/19 2221 (!)  141/73     Pulse Rate 01/15/19 2221 88     Resp 01/15/19 2221 20     Temp 01/15/19 2221 98 F (36.7 C)     Temp Source 01/15/19 2221 Oral     SpO2 01/15/19 2221 98 %     Weight 01/15/19 2219 250 lb (113.4 kg)     Height 01/15/19 2219 5\' 11"  (1.803 m)     Head Circumference --      Peak Flow --      Pain Score 01/15/19 2219 5     Pain Loc --      Pain Edu? --      Excl. in [place]? --     Constitutional: Alert and oriented. Well appearing and in no acute distress. Eyes: Conjunctivae are normal.  Head: Atraumatic. Nose: No congestion/rhinnorhea. Mouth/Throat: Mucous membranes are moist.   Neck:  supple no lymphadenopathy noted Cardiovascular: Normal rate, regular rhythm.  Respiratory: Normal respiratory effort.  No retractions,  GU: deferred Musculoskeletal: FROM all extremities, warm and well perfused, right wrist is tender at the carpal bones, neurovascular is intact Neurologic:  Normal speech and language.  Skin:  Skin is warm, dry and intact. No rash noted. Psychiatric: Mood and affect are normal. Speech and behavior are normal.  ____________________________________________   LABS (all labs ordered are listed, but only abnormal results are displayed)  Labs Reviewed - No data to display ____________________________________________   ____________________________________________  RADIOLOGY  [place]  of the right wrist shows a scaphoid fracture  ____________________________________________   PROCEDURES  Procedure(s) performed: Thumb spica splint applied by the tech   Procedures    ____________________________________________   INITIAL IMPRESSION / ASSESSMENT AND PLAN / ED COURSE  Pertinent labs & imaging results that were available during my care of the patient were reviewed by me and considered in my medical decision making (see chart for details).   Patient is 33 year old male presents emergency department complaint of right wrist pain.  See HPI  Physical  exam shows the right wrist to be tender at the carpal bones  X-ray of the right wrist shows a scaphoid fracture  Explained findings to the patient.  He is placed in a thumb spica OCL.  [facility].  Given a prescription for meloxicam 15 mg daily.  He is to wear his splint until evaluated by orthopedics.  He states he understands and will comply.  Is discharged stable condition.    Anthony Parks was evaluated in Emergency Department on 01/15/2019 for the symptoms described in the history of present illness. He was evaluated in the context of the global COVID-19 pandemic, which necessitated consideration that the patient might be at risk for infection with the SARS-CoV-2 virus that causes COVID-19. Institutional protocols and algorithms that pertain to the evaluation of patients at risk for COVID-19 are in a state of rapid change based on information released by regulatory bodies including the CDC and federal and state organizations. These policies and algorithms were followed during the patient's care in the ED.   As part of my medical decision making, I reviewed the following data within the [place] notes reviewed and incorporated, Old chart reviewed, Radiograph reviewed x-ray of the right wrist shows scaphoid fracture, Notes from prior ED visits and  Controlled Substance Database  ____________________________________________   FINAL CLINICAL IMPRESSION(S) / ED DIAGNOSES  Final diagnoses:  Closed nondisplaced fracture of scaphoid of right wrist, unspecified portion of scaphoid, initial encounter      NEW MEDICATIONS STARTED DURING THIS VISIT:  New Prescriptions   MELOXICAM (MOBIC) 15 MG TABLET    Take 1 tablet (15 mg total) by mouth daily.     Note:  This document was prepared using Dragon voice recognition software and may include unintentional dictation errors.    Versie Starks, PA-C 01/15/19 2256    Carrie Mew, MD  01/16/19 2025

## 2019-01-29 ENCOUNTER — Other Ambulatory Visit: Payer: Self-pay | Admitting: Specialist

## 2019-01-29 DIAGNOSIS — S62031A Displaced fracture of proximal third of navicular [scaphoid] bone of right wrist, initial encounter for closed fracture: Secondary | ICD-10-CM

## 2019-02-04 ENCOUNTER — Ambulatory Visit
Admission: RE | Admit: 2019-02-04 | Discharge: 2019-02-04 | Disposition: A | Discharge status: Home / Self Care | Payer: Self-pay | Source: Ambulatory Visit | Attending: Specialist | Admitting: Specialist

## 2019-02-04 ENCOUNTER — Other Ambulatory Visit: Payer: Self-pay

## 2019-02-04 DIAGNOSIS — S62031A Displaced fracture of proximal third of navicular [scaphoid] bone of right wrist, initial encounter for closed fracture: Secondary | ICD-10-CM | POA: Insufficient documentation

## 2019-04-20 ENCOUNTER — Other Ambulatory Visit: Payer: Self-pay

## 2019-04-21 ENCOUNTER — Ambulatory Visit: Payer: Self-pay | Attending: Internal Medicine

## 2019-04-21 DIAGNOSIS — Z20828 Contact with and (suspected) exposure to other viral communicable diseases: Secondary | ICD-10-CM | POA: Insufficient documentation

## 2019-04-21 DIAGNOSIS — Z20822 Contact with and (suspected) exposure to covid-19: Secondary | ICD-10-CM

## 2019-04-22 LAB — NOVEL CORONAVIRUS, NAA: SARS-CoV-2, NAA: NOT DETECTED

## 2019-04-28 ENCOUNTER — Other Ambulatory Visit: Payer: Self-pay | Admitting: Orthopaedic Surgery

## 2019-04-28 DIAGNOSIS — S62031A Displaced fracture of proximal third of navicular [scaphoid] bone of right wrist, initial encounter for closed fracture: Secondary | ICD-10-CM

## 2019-05-08 ENCOUNTER — Ambulatory Visit
Admission: RE | Admit: 2019-05-08 | Discharge: 2019-05-08 | Disposition: A | Discharge status: Home / Self Care | Payer: Self-pay | Source: Ambulatory Visit | Attending: Orthopaedic Surgery | Admitting: Orthopaedic Surgery

## 2019-05-08 ENCOUNTER — Other Ambulatory Visit: Payer: Self-pay

## 2019-05-08 DIAGNOSIS — S62031A Displaced fracture of proximal third of navicular [scaphoid] bone of right wrist, initial encounter for closed fracture: Secondary | ICD-10-CM | POA: Insufficient documentation

## 2021-07-10 IMAGING — CT CT WRIST*R* W/O CM
3 of 10 series · 13 of 37 positions shown, 15 images · non-contrast
Comparison: CT scan 02/04/2019

CLINICAL DATA: Followup navicular fracture.

EXAM:
CT OF THE RIGHT WRIST WITHOUT CONTRAST
TECHNIQUE: Multidetector CT imaging of the right wrist was performed according
to the standard protocol. Multiplanar CT image reconstructions were
also generated.

[Series 2: axial bone wrist hd fov 1.50 ax · axial · 0.18mm/px · z∈[-647,-569]mm · 5 of 155 slices shown, 7 images]
[im 26/155  soft-tissue]
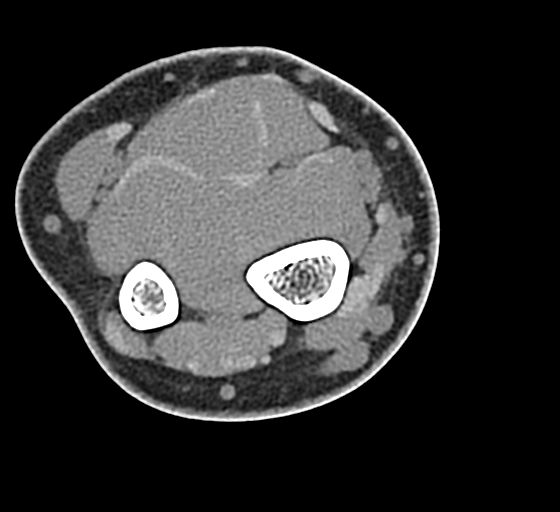
[im 26/155  bone]
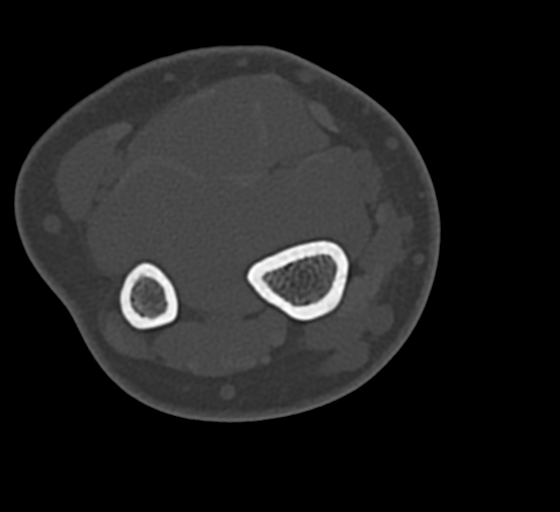
[im 52/155  bone]
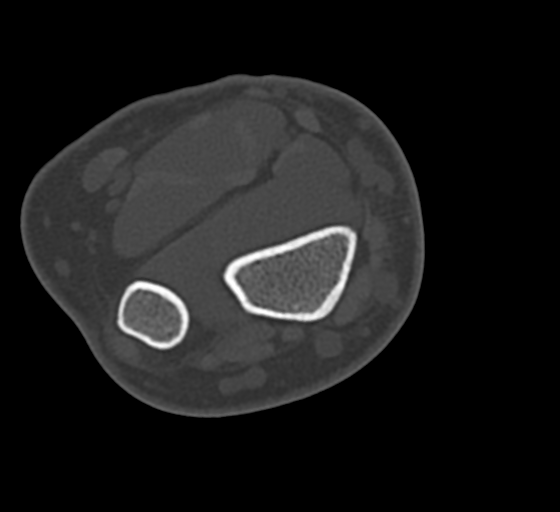
[im 78/155  bone]
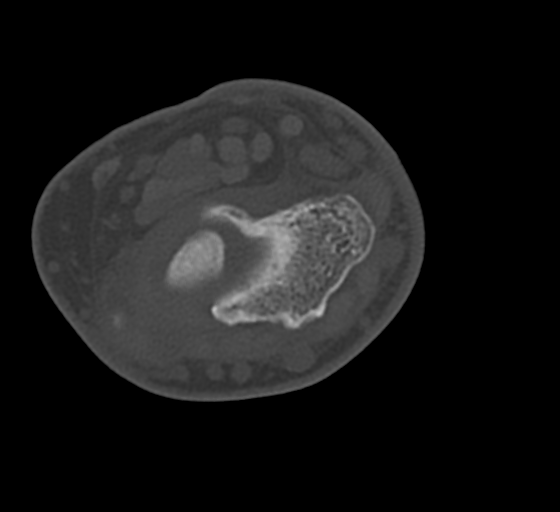
[im 103/155  bone]
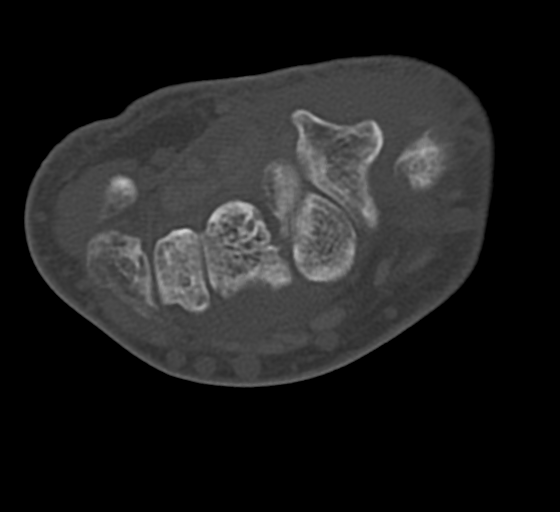
[im 129/155  soft-tissue]
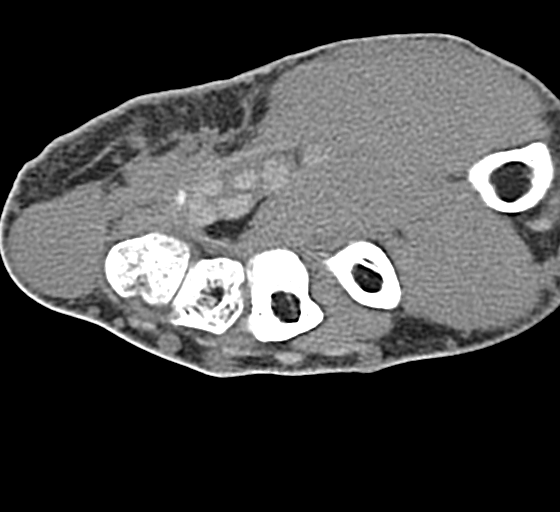
[im 129/155  bone]
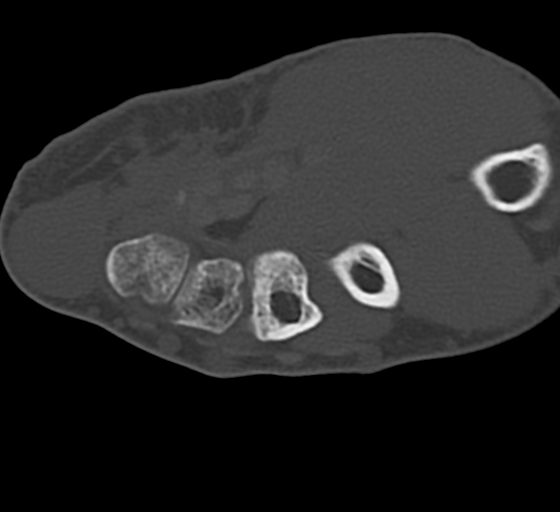

[Series 4: axial st wrist hd fov 1.50 ax · axial · 0.18mm/px · z∈[-647,-569]mm · 5 of 155 slices shown]
[im 26/155  bone]
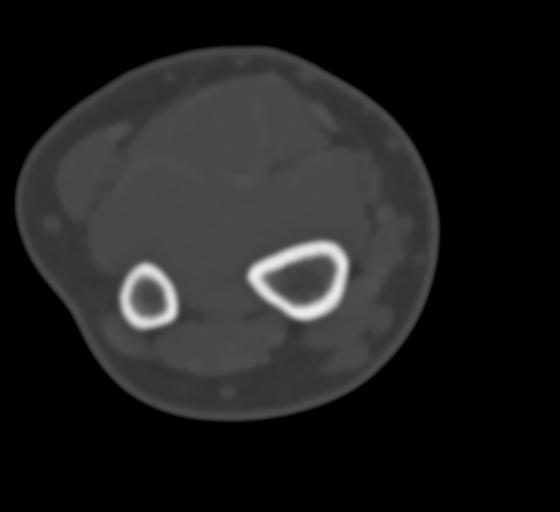
[im 52/155  bone]
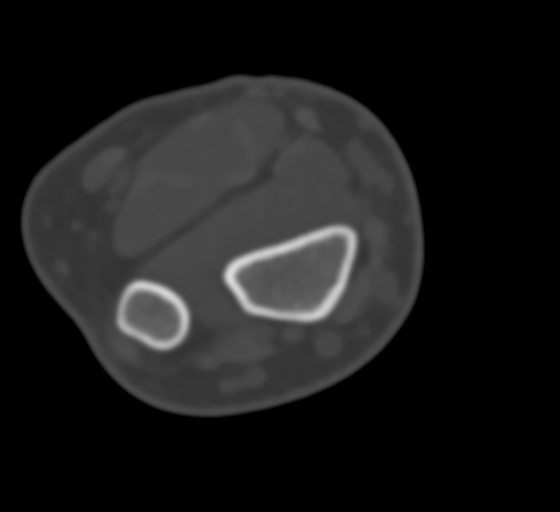
[im 78/155  bone]
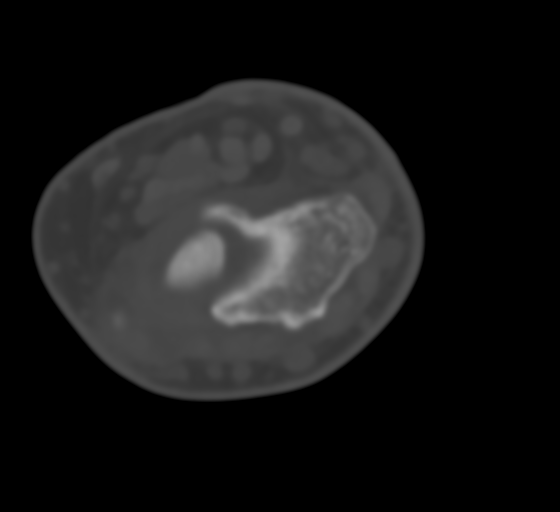
[im 103/155  bone]
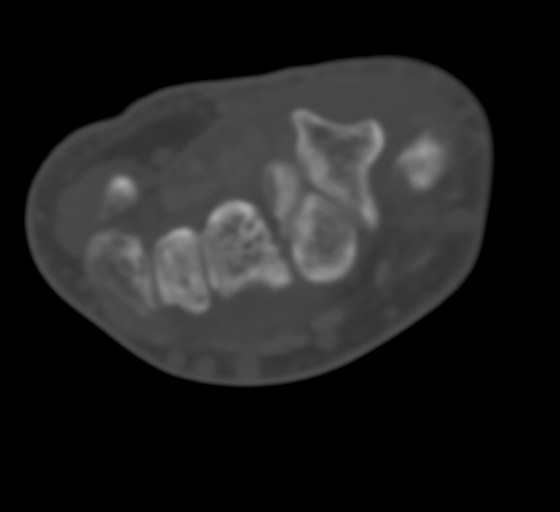
[im 129/155  bone]
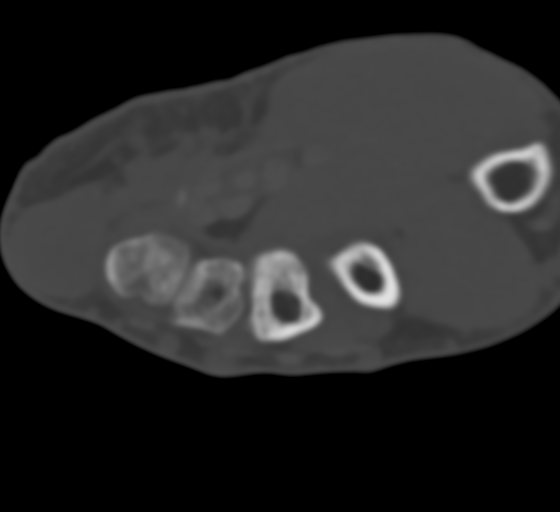

[Series 12: sag bone wrist hd fov 1.50 sag · sagittal · 0.18mm/px · 3 of 144 slices shown]
[im 48/144  bone]
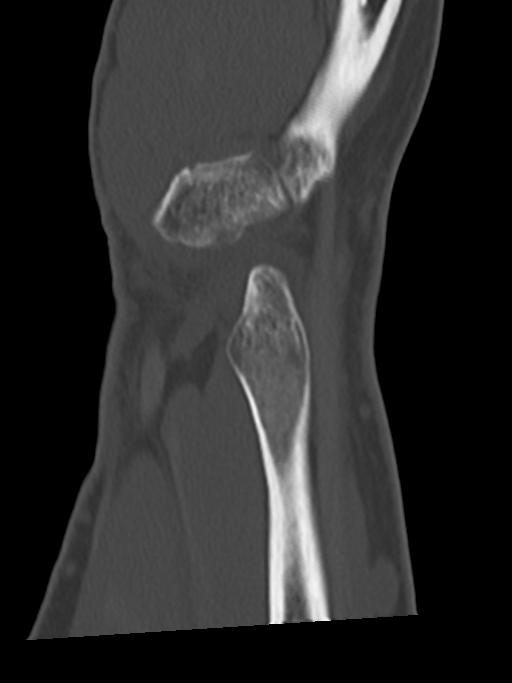
[im 53/144  soft-tissue]
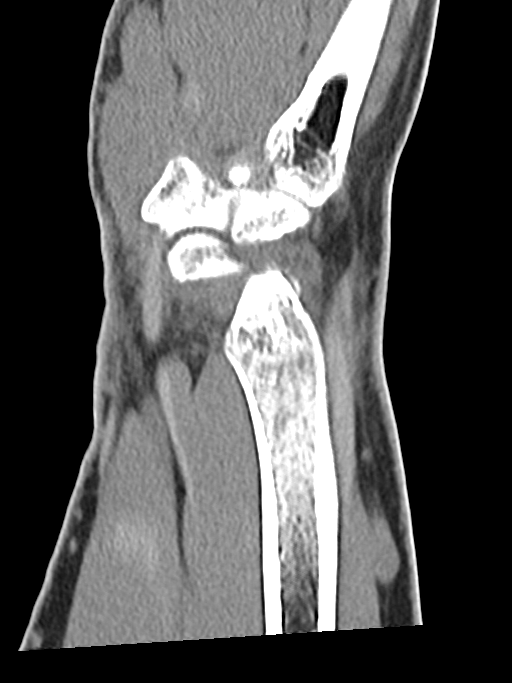
[im 96/144  bone]
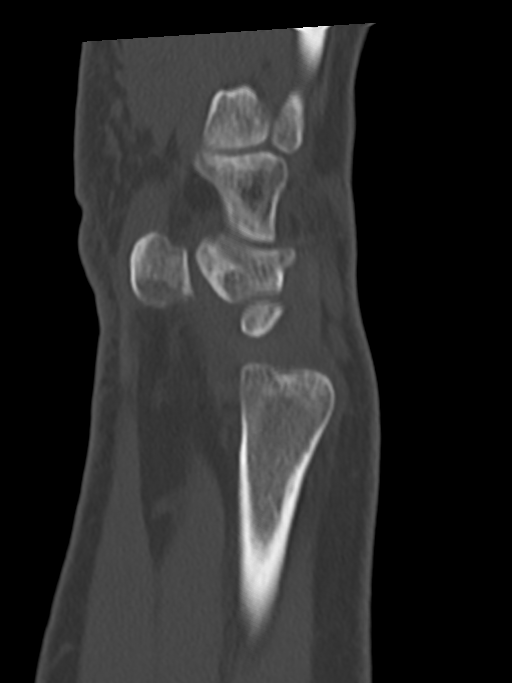

[13 of 37 positions shown; findings below may reference images not displayed]

FINDINGS: Definite interval healing changes with areas of bony
ingrowth/osseous bridging across the fracture site. The fracture is
not completely healed. No sclerotic changes involving the proximal
pole to suggest AVN.

The radiocarpal and intercarpal joint spaces are maintained. No
other fractures are identified. No significant degenerative changes.

No significant soft tissue abnormalities are identified. The major
tendons appear normal. Normal hand and wrist musculature.
IMPRESSION: 1. Incompletely healed nondisplaced navicular waist fracture but
definite interval healing changes since the prior CT scan.
2. No other significant bony findings.
